# Patient Record
Sex: Male | Born: 1965 | Race: Black or African American | Hispanic: No | Marital: Married | Smoking: Current some day smoker
Health system: Southern US, Community
[De-identification: ages and names within clinical notes are randomized; demographics above are authoritative.]

## PROBLEM LIST (undated history)

## (undated) DIAGNOSIS — D75A Glucose-6-phosphate dehydrogenase (G6PD) deficiency without anemia: Secondary | ICD-10-CM

## (undated) HISTORY — PX: OTHER SURGICAL HISTORY: SHX169

## (undated) HISTORY — PX: ABDOMINAL SURGERY: SHX537

---

## 1988-08-26 HISTORY — PX: REVISION COLOSTOMY: SHX1039

## 2004-12-22 ENCOUNTER — Emergency Department: Payer: Self-pay | Admitting: Emergency Medicine

## 2006-04-04 ENCOUNTER — Emergency Department (HOSPITAL_COMMUNITY): Admission: EM | Admit: 2006-04-04 | Discharge: 2006-04-04 | Payer: Self-pay | Admitting: Emergency Medicine

## 2010-01-23 ENCOUNTER — Ambulatory Visit: Payer: Self-pay | Admitting: Orthopedic Surgery

## 2012-01-22 ENCOUNTER — Emergency Department (HOSPITAL_COMMUNITY)
Admission: EM | Admit: 2012-01-22 | Discharge: 2012-01-22 | Disposition: A | Discharge status: Home / Self Care | Payer: No Typology Code available for payment source | Attending: Emergency Medicine | Admitting: Emergency Medicine

## 2012-01-22 ENCOUNTER — Emergency Department (HOSPITAL_COMMUNITY): Payer: No Typology Code available for payment source

## 2012-01-22 ENCOUNTER — Encounter (HOSPITAL_COMMUNITY): Payer: Self-pay | Admitting: Emergency Medicine

## 2012-01-22 DIAGNOSIS — S161XXA Strain of muscle, fascia and tendon at neck level, initial encounter: Secondary | ICD-10-CM

## 2012-01-22 DIAGNOSIS — S4990XA Unspecified injury of shoulder and upper arm, unspecified arm, initial encounter: Secondary | ICD-10-CM

## 2012-01-22 DIAGNOSIS — S139XXA Sprain of joints and ligaments of unspecified parts of neck, initial encounter: Secondary | ICD-10-CM | POA: Insufficient documentation

## 2012-01-22 DIAGNOSIS — Z79899 Other long term (current) drug therapy: Secondary | ICD-10-CM | POA: Insufficient documentation

## 2012-01-22 DIAGNOSIS — Z95810 Presence of automatic (implantable) cardiac defibrillator: Secondary | ICD-10-CM | POA: Insufficient documentation

## 2012-01-22 DIAGNOSIS — R51 Headache: Secondary | ICD-10-CM | POA: Insufficient documentation

## 2012-01-22 DIAGNOSIS — M542 Cervicalgia: Secondary | ICD-10-CM | POA: Insufficient documentation

## 2012-01-22 DIAGNOSIS — M25519 Pain in unspecified shoulder: Secondary | ICD-10-CM | POA: Insufficient documentation

## 2012-01-22 DIAGNOSIS — Y92410 Unspecified street and highway as the place of occurrence of the external cause: Secondary | ICD-10-CM | POA: Insufficient documentation

## 2012-01-22 DIAGNOSIS — M549 Dorsalgia, unspecified: Secondary | ICD-10-CM | POA: Insufficient documentation

## 2012-01-22 MED ORDER — HYDROCODONE-ACETAMINOPHEN 5-325 MG PO TABS: 1.0000 | ORAL_TABLET | Freq: Four times a day (QID) | ORAL | Status: AC | PRN

## 2012-01-22 MED ORDER — IBUPROFEN 800 MG PO TABS: 800.0000 mg | ORAL_TABLET | Freq: Three times a day (TID) | ORAL | Status: AC | PRN

## 2012-01-22 MED ORDER — OXYCODONE-ACETAMINOPHEN 5-325 MG PO TABS
1.0000 | ORAL_TABLET | Freq: Once | ORAL | Status: AC
  Administered 2012-01-22: 1 via ORAL
  Filled 2012-01-22: qty 1
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
## 2012-01-22 NOTE — Discharge Instructions (Signed)
Return here as needed. Follow up with the orthopedist as needed. Ice and heat on the areas that are sore.

## 2012-01-22 NOTE — ED Notes (Signed)
Patient resting quietly, calm with unlabored respirations.   Wife at bedside with patient.
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
## 2012-01-22 NOTE — ED Notes (Signed)
Small abrasion noted on left elbow.

## 2012-01-24 ENCOUNTER — Emergency Department: Payer: Self-pay | Admitting: Emergency Medicine
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
## 2012-01-24 NOTE — ED Provider Notes (Signed)
Medical screening examination/treatment/procedure(s) were performed by non-physician practitioner and as supervising physician I was immediately available for consultation/collaboration.  Cheri Guppy, MD 01/24/12 [phone]

## 2012-05-20 ENCOUNTER — Ambulatory Visit: Payer: Self-pay | Admitting: Unknown Physician Specialty
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
## 2016-12-31 ENCOUNTER — Emergency Department (HOSPITAL_COMMUNITY)
Admission: EM | Admit: 2016-12-31 | Discharge: 2016-12-31 | Disposition: A | Discharge status: Home / Self Care | Payer: Non-veteran care | Attending: Emergency Medicine | Admitting: Emergency Medicine

## 2016-12-31 ENCOUNTER — Encounter (HOSPITAL_COMMUNITY): Payer: Self-pay | Admitting: Nurse Practitioner

## 2016-12-31 DIAGNOSIS — Y929 Unspecified place or not applicable: Secondary | ICD-10-CM | POA: Diagnosis not present

## 2016-12-31 DIAGNOSIS — S62112A Displaced fracture of triquetrum [cuneiform] bone, left wrist, initial encounter for closed fracture: Secondary | ICD-10-CM | POA: Insufficient documentation

## 2016-12-31 DIAGNOSIS — W109XXA Fall (on) (from) unspecified stairs and steps, initial encounter: Secondary | ICD-10-CM | POA: Insufficient documentation

## 2016-12-31 DIAGNOSIS — Z79899 Other long term (current) drug therapy: Secondary | ICD-10-CM | POA: Diagnosis not present

## 2016-12-31 DIAGNOSIS — Y999 Unspecified external cause status: Secondary | ICD-10-CM | POA: Insufficient documentation

## 2016-12-31 DIAGNOSIS — Y939 Activity, unspecified: Secondary | ICD-10-CM | POA: Diagnosis not present

## 2016-12-31 DIAGNOSIS — F17290 Nicotine dependence, other tobacco product, uncomplicated: Secondary | ICD-10-CM | POA: Insufficient documentation

## 2016-12-31 DIAGNOSIS — S6992XA Unspecified injury of left wrist, hand and finger(s), initial encounter: Secondary | ICD-10-CM | POA: Diagnosis present

## 2016-12-31 HISTORY — DX: Glucose-6-phosphate dehydrogenase (G6PD) deficiency without anemia: D75.A

## 2016-12-31 NOTE — ED Notes (Signed)
Provided patient with ice pack
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
## 2016-12-31 NOTE — ED Triage Notes (Signed)
Pt presents with c/o L wrist fracture. He fell onto the wrist yesterday and [facility] in [place] for pain. he was sent to ED for further treatment of triquetral fracture of the L wrist found on imagine there.
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
